# Patient Record
Sex: Male | Born: 1985 | Race: White | Hispanic: No | Marital: Married | Smoking: Never smoker
Health system: Southern US, Community
[De-identification: ages and names within clinical notes are randomized; demographics above are authoritative.]

---

## 2011-11-20 ENCOUNTER — Encounter (HOSPITAL_COMMUNITY): Payer: Self-pay | Admitting: *Deleted

## 2011-11-20 ENCOUNTER — Emergency Department (HOSPITAL_COMMUNITY)
Admission: EM | Admit: 2011-11-20 | Discharge: 2011-11-20 | Disposition: A | Discharge status: Home / Self Care | Payer: BC Managed Care – PPO | Source: Home / Self Care | Attending: Emergency Medicine | Admitting: Emergency Medicine

## 2011-11-20 DIAGNOSIS — IMO0002 Reserved for concepts with insufficient information to code with codable children: Secondary | ICD-10-CM

## 2011-11-20 DIAGNOSIS — T148XXA Other injury of unspecified body region, initial encounter: Secondary | ICD-10-CM

## 2011-11-20 MED ORDER — MUPIROCIN 2 % EX OINT: TOPICAL_OINTMENT | Freq: Three times a day (TID) | CUTANEOUS | Status: AC

## 2011-11-20 MED ORDER — CEPHALEXIN 500 MG PO CAPS: 500.0000 mg | ORAL_CAPSULE | Freq: Three times a day (TID) | ORAL | Status: AC

## 2011-11-20 NOTE — ED Provider Notes (Signed)
Chief Complaint  Patient presents with  . Wound Check    History of Present Illness:  Scott Kennedy is a 26 year old male who has a laceration on the plantar surface of the right foot. He kicked a soda machine about 3 days ago, and he has a flap of skin that has been been dangling. There is no evidence of infection, no erythema, swelling, or pus. He thinks his last tetanus shot was a year or 2 ago.  Review of Systems:  Other than noted above, the patient denies any of the following symptoms: Systemic:  No fever or chills. Musculoskeletal:  No joint pain or decreased range of motion. Neuro:  No numbness, tingling, or weakness.  PMFSH:  Past medical history, family history, social history, meds, and allergies were reviewed.  Physical Exam:   Vital signs:  BP 145/83  Pulse 67  Temp 98.1 F (36.7 C) (Oral)  Resp 17  SpO2 100% Ext:  There is a 1 cm evulsion laceration on the plantar surface of the right foot just underneath the MTP joint of the great toe. There is a flap of devitalized skin that is only attached on one side, no evidence of infection, no erythema, tenderness to palpation, or purulent drainage.  All joints had a full ROM without pain.  Pulses were full.  Good capillary refill in all digits.  No edema. Neurological:  Alert and oriented.  No muscle weakness.  Sensation was intact to light touch.   Procedure: Verbal informed consent was obtained.  The patient was informed of the risks and benefits of the procedure and understands and accepts.  Identity of the patient was verified verbally and by wristband.   The laceration area described above was prepped with Betadine and the flap of skin was just snipped off without any anesthesia. It was then cleaned up with saline, antibiotic ointment was applied, and a nonstick dressing. He was instructed in wound care.   Assessment:  The encounter diagnosis was Laceration.  Plan:   1.  The following meds were prescribed:   New Prescriptions   CEPHALEXIN (KEFLEX) 500 MG CAPSULE    Take 1 capsule (500 mg total) by mouth 3 (three) times daily.   MUPIROCIN OINTMENT (BACTROBAN) 2 %    Apply topically 3 (three) times daily.   2.  The patient was instructed in wound care and pain control, and handouts were given. 3.  The patient was told to return if any sign of infection.     Reuben Likes, MD 11/20/11 1250

## 2011-11-20 NOTE — ED Notes (Addendum)
Reports cutting bottom of right foot on corner of flapping door to vending machine on Thursday.  Has been applying abx oint and bandages, but c/o site still oozing blood intermittently.  Denies any S/S infection.  Avulsion of calloused area noted without active bleeding.  States has been unable to rest foot (in process of a move).  Last tetanus 2010.

## 2014-05-29 ENCOUNTER — Encounter (HOSPITAL_COMMUNITY): Payer: Self-pay | Admitting: Emergency Medicine

## 2014-05-29 ENCOUNTER — Emergency Department (HOSPITAL_COMMUNITY): Payer: BLUE CROSS/BLUE SHIELD

## 2014-05-29 ENCOUNTER — Emergency Department (HOSPITAL_COMMUNITY)
Admission: EM | Admit: 2014-05-29 | Discharge: 2014-05-30 | Disposition: A | Discharge status: Home / Self Care | Payer: BLUE CROSS/BLUE SHIELD | Attending: Emergency Medicine | Admitting: Emergency Medicine

## 2014-05-29 DIAGNOSIS — W2102XA Struck by soccer ball, initial encounter: Secondary | ICD-10-CM | POA: Insufficient documentation

## 2014-05-29 DIAGNOSIS — T1490XA Injury, unspecified, initial encounter: Secondary | ICD-10-CM

## 2014-05-29 DIAGNOSIS — S99912A Unspecified injury of left ankle, initial encounter: Secondary | ICD-10-CM | POA: Diagnosis present

## 2014-05-29 DIAGNOSIS — Y92322 Soccer field as the place of occurrence of the external cause: Secondary | ICD-10-CM | POA: Diagnosis not present

## 2014-05-29 DIAGNOSIS — S8252XA Displaced fracture of medial malleolus of left tibia, initial encounter for closed fracture: Secondary | ICD-10-CM | POA: Diagnosis not present

## 2014-05-29 DIAGNOSIS — Y9366 Activity, soccer: Secondary | ICD-10-CM | POA: Insufficient documentation

## 2014-05-29 DIAGNOSIS — T148XXA Other injury of unspecified body region, initial encounter: Secondary | ICD-10-CM

## 2014-05-29 DIAGNOSIS — S93402A Sprain of unspecified ligament of left ankle, initial encounter: Secondary | ICD-10-CM

## 2014-05-29 DIAGNOSIS — Y998 Other external cause status: Secondary | ICD-10-CM | POA: Diagnosis not present

## 2014-05-29 NOTE — ED Notes (Signed)
Pt st's he was playing soccer and twisted left ankle.

## 2014-05-30 MED ORDER — HYDROCODONE-ACETAMINOPHEN 5-325 MG PO TABS: 1.0000 | ORAL_TABLET | Freq: Four times a day (QID) | ORAL | Status: AC | PRN

## 2014-05-30 NOTE — ED Notes (Signed)
Ortho tech paged and will come apply cam walker

## 2014-05-30 NOTE — ED Notes (Signed)
Ortho tech at bedside 

## 2014-05-30 NOTE — Discharge Instructions (Signed)
Please call your doctor for a followup appointment within 24-48 hours. When you talk to your doctor please let them know that you were seen in the emergency department and have them acquire all of your records so that they can discuss the findings with you and formulate a treatment plan to fully care for your new and ongoing problems. Please call and set-up an appointment with orthopedics Please rest, ice, elevate-toes above nose Within the next 72 hours icing is crucial for reducing swelling Please slowly introduce heat after the next 72 hours and massage to aid in reabsorption of the hematoma Please rest and stay hydrated Please take medications as prescribed - while on pain medications there is to be no drinking alcohol, driving, operating any heavy machinery. If extra please dispose in a proper manner. Please do not take any extra Tylenol with this medication for this can lead to Tylenol overdose and liver issues.  Please continue to monitor symptoms closely and if symptoms are to worsen or change (fever greater than 101, chills, sweating, nausea, vomiting, chest pain, shortness of breathe, difficulty breathing, weakness, numbness, tingling, worsening or changes to pain pattern, fall, injury, changes to skin color, swelling to the leg, increased pain with moving toes) please report back to the Emergency Department immediately.    Ankle Sprain An ankle sprain is an injury to the strong, fibrous tissues (ligaments) that hold the bones of your ankle joint together.  CAUSES An ankle sprain is usually caused by a fall or by twisting your ankle. Ankle sprains most commonly occur when you step on the outer edge of your foot, and your ankle turns inward. People who participate in sports are more prone to these types of injuries.  SYMPTOMS   Pain in your ankle. The pain may be present at rest or only when you are trying to stand or walk.  Swelling.  Bruising. Bruising may develop immediately or within  1 to 2 days after your injury.  Difficulty standing or walking, particularly when turning corners or changing directions. DIAGNOSIS  Your caregiver will ask you details about your injury and perform a physical exam of your ankle to determine if you have an ankle sprain. During the physical exam, your caregiver will press on and apply pressure to specific areas of your foot and ankle. Your caregiver will try to move your ankle in certain ways. An X-ray exam may be done to be sure a bone was not broken or a ligament did not separate from one of the bones in your ankle (avulsion fracture).  TREATMENT  Certain types of braces can help stabilize your ankle. Your caregiver can make a recommendation for this. Your caregiver may recommend the use of medicine for pain. If your sprain is severe, your caregiver may refer you to a surgeon who helps to restore function to parts of your skeletal system (orthopedist) or a physical therapist. HOME CARE INSTRUCTIONS   Apply ice to your injury for 1-2 days or as directed by your caregiver. Applying ice helps to reduce inflammation and pain.  Put ice in a plastic bag.  Place a towel between your skin and the bag.  Leave the ice on for 15-20 minutes at a time, every 2 hours while you are awake.  Only take over-the-counter or prescription medicines for pain, discomfort, or fever as directed by your caregiver.  Elevate your injured ankle above the level of your heart as much as possible for 2-3 days.  If your caregiver recommends crutches,  use them as instructed. Gradually put weight on the affected ankle. Continue to use crutches or a cane until you can walk without feeling pain in your ankle.  If you have a plaster splint, wear the splint as directed by your caregiver. Do not rest it on anything harder than a pillow for the first 24 hours. Do not put weight on it. Do not get it wet. You may take it off to take a shower or bath.  You may have been given an  elastic bandage to wear around your ankle to provide support. If the elastic bandage is too tight (you have numbness or tingling in your foot or your foot becomes cold and blue), adjust the bandage to make it comfortable.  If you have an air splint, you may blow more air into it or let air out to make it more comfortable. You may take your splint off at night and before taking a shower or bath. Wiggle your toes in the splint several times per day to decrease swelling. SEEK MEDICAL CARE IF:   You have rapidly increasing bruising or swelling.  Your toes feel extremely cold or you lose feeling in your foot.  Your pain is not relieved with medicine. SEEK IMMEDIATE MEDICAL CARE IF:  Your toes are numb or blue.  You have severe pain that is increasing. MAKE SURE YOU:   Understand these instructions.  Will watch your condition.  Will get help right away if you are not doing well or get worse. Document Released: 05/02/2005 Document Revised: 01/25/2012 Document Reviewed: 05/14/2011 [facility] Patient Information 2015 [place], [place]. This information is not intended to replace advice given to you by your health care provider. Make sure you discuss any questions you have with your health care provider.   Emergency Department Resource Guide 1) Find a Doctor and Pay Out of Pocket Although you won't have to find out who is covered by your insurance plan, it is a good idea to ask around and get recommendations. You will then need to call the office and see if the doctor you have chosen will accept you as a new patient and what types of options they offer for patients who are self-pay. Some doctors offer discounts or will set up payment plans for their patients who do not have insurance, but you will need to ask so you aren't surprised when you get to your appointment.  2) Contact Your Local Health Department Not all health departments have doctors that can see patients for sick visits, but many do, so  it is worth a call to see if yours does. If you don't know where your local health department is, you can check in your phone book. The CDC also has a tool to help you locate your state's health department, and many state websites also have listings of all of their local health departments.  3) Find a Walk-in Clinic If your illness is not likely to be very severe or complicated, you may want to try a walk in clinic. These are popping up all over the country in pharmacies, drugstores, and shopping centers. They're usually staffed by nurse practitioners or physician assistants that have been trained to treat common illnesses and complaints. They're usually fairly quick and inexpensive. However, if you have serious medical issues or chronic medical problems, these are probably not your best option.  No Primary Care Doctor: - Call Health Connect at  [phone] - they can help you locate a primary care doctor that  accepts your insurance, provides certain services, etc. - Physician Referral Service- [phone]  Chronic Pain Problems: Organization         Address  Phone   Notes  [facility]  [phone] Patients need to be referred by their primary care doctor.   Medication Assistance: Organization         Address  Phone   Notes  [facility] Medication [facility] [address] [place], [place] [address] [phone] --Must be a resident of [facility] -- Must have NO insurance coverage whatsoever (no Medicaid/ Medicare, etc.) -- The pt. MUST have a primary care doctor that directs their care regularly and follows them in the community   [facility]  [phone]   [employer]  [phone]    Agencies that provide inexpensive medical care: Organization         Address  Phone   Notes  [facility]  [phone]   [facility]    [phone]   [facility] [address] [place], [place] [address] [phone]   [facility] [address] [place] [place] [phone]   Planned Parenthood    [phone]   [facility]    [phone]   [facility]  [address], [place] Phone:  [phone], Fax:  [phone] Hours of Operation:  9 am - 6 pm, M-F.  Also accepts Medicaid/Medicare and self-pay.  [facility]  [address], [place] Phone: [phone], Fax: [phone]. Hours of Operation:  8:30 am - 5:30 pm, M-F.  Also accepts Medicaid and self-pay.  [facility] [place] [address] [place] Phone: [phone]   [facility] [address] Natasha Bence [place], [place] [phone], Ext. [phone] Mondays & Thursdays: 7-9 AM.  First 15 patients are seen on a first come, first serve basis.    Medicaid-accepting [facility] Providers:  Organization         Address  Phone   Notes  [facility] [address], [place] [phone] Also accepts self-pay patients.  [facility] [address] Laurell Josephs [place], [place]  [phone]   [facility] [address], [place] [phone]   [facility] [address] [place] [phone]   Renaye Rakers [address], [place]   [phone] Only accepts [place] Access [place] patients after they have their name applied to their card.   Self-Pay (no insurance) in [facility]:  Organization         Address  Phone   Notes  Sickle Cell Patients, [facility] [address] [place], [place] [phone]   [facility] [address] [place], [place] [phone]   [facility]  [address], [place] [phone]   [facility]/Dr. Osei-Bonsu  [address] [place] or  [address], [place] [phone] Phone number for both [place] and [place] locations is the same.  [facility] [address] [place] [phone]   [facility] [address], [place] or [address] [phone]   [facility] [address], [place] [phone]) [phone], phone; [phone](336) [phone], fax Sees patients 1st and 3rd Saturday of every month.  Must not qualify for public or private insurance (i.e. Medicaid, Medicare, [place] Health Choice, Veterans' Benefits)  Household income should be no more than 200% of the poverty level The clinic cannot treat you if you are pregnant or think you are pregnant  Sexually transmitted diseases are not treated at the clinic.    Dental Care: Organization         Address  Phone  Notes  [facility] [address], [place] [phone]) [phone] Accepts children up to age 30 who are enrolled in [place] or [place] Health Choice; pregnant women with a Medicaid card; and children who have applied for Medicaid or [place] Health Choice, but were declined, whose parents can pay a reduced fee at time of service.  [facility] [place]  [address], [place] [phone]) [phone] Accepts children up to age 30 who are enrolled in [place] or [place] Health Choice; pregnant women with a Medicaid card; and children who have applied for Medicaid or  Health Choice, but were declined, whose parents can pay a reduced fee at time of service.  [facility]  [address], [place] [phone] Patients are seen by appointment only. Walk-ins are not accepted. [facility] will see patients 30 years of age and older. Monday - Tuesday (8am-5pm) Most Wednesdays (8:30-5pm) $30 per visit, cash only  [facility]  [address] [place] [address], [facility]  [place] [phone] Patients are seen by appointment only. Walk-ins are not accepted. [facility] will see patients 30 years of age and older. One Wednesday Evening (Monthly: Volunteer Based).  $30 per visit, cash only  [facility]  [phone] for adults; Children under age 744, call Graduate Pediatric Dentistry at [phone]) [phone]. Children aged 364-14, please call [phone] to request a pediatric application.  Dental services are provided in all areas of dental care including fillings, crowns and bridges, complete and partial dentures, implants, gum treatment, root canals, and extractions. Preventive care is also provided. Treatment is provided to both adults and children. Patients are selected via a lottery and there is often a waiting list.   [facility] [address], [place]  [phone]) [phone] [email or web address]   [facility] [address], [place], [place] [phone], Ext. [phone] Second and Fourth Thursday of each month, opens at 6:30 AM; Clinic ends at 9 AM.  Patients are seen on a first-come first-served basis, and a limited number are seen during each clinic.   [facility]  [address], [place], [place] [phone]) [phone]   Eligibility Requirements You must have lived in [place], [place], or [place] counties for at least the last three months.   You cannot be eligible for state or federal sponsored [place]healthcare insurance, including CIGNAVeterans Administration, [place]icaid, or [employer]icare.   You generally cannot be eligible for healthcare insurance through your employer.    How to apply: Eligibility screenings are held every Tuesday and Wednesday afternoon from 1:00 pm until 4:00 pm. You do not need an appointment for the interview!  [facility] [address], [place], [place] [phone]   [facility]  [phone]   [facility]  [phone]   [facility]  [phone]    Behavioral Health Resources in the Community: Intensive Outpatient Programs Organization         Address  Phone  Notes  [facility] Services [address] [place], [place] [phone]   [facility] Outpatient [address] [place], [place] [phone]   [facility] [address], [place], [place]  [phone]   [facility] Mental Health [address]  [place], [place] [phone] or [phone]   Substance Abuse Resources Organization         Address  Phone  Notes  [facility]  [phone]   [facility]  [phone]   [facility]  [phone]   Floydene Flock  [phone]   Residential & Outpatient Substance Abuse Program  [phone]   Psychological Services Organization         Address  Phone  Notes  [facility]  [phone]   [facility]  [phone]   [facility] Mental Health [address], [place] [phone] or [phone]    Mobile Crisis Teams Organization         Address  Phone  Notes  [facility], Mobile Crisis Care Unit  [phone]   [facility]  [address] [place], [place] [phone]   Doristine Locks [address] [place] [phone]    Self-Help/Support Groups Organization         Address  Phone             Notes  [facility] [place] - variety of support groups  336- I7437963 Call for more information  Narcotics Anonymous (NA), [facility] [address], [employer] [place]  2 meetings at this location   Statistician         Address  Phone  Notes  [facility] [address],    [place]  [phone]   [facility]  [address] [place] [address], [place], [place] [phone]   [facility] [address] [place], [place] [phone]  Admissions: 8am-3pm M-F  [facility] [address]    [place], [place] [phone]   [facility] [address] [place], [place], [place] [phone]   [facility] [address],  [place], [place] [phone]   [facility] - Intensive Outpatient [address]ure[address] Joseph[address], [place], [place] [phone]   [facility] ([facility] [address] [place]  [place], [place] [phone] or [phone]   [facility] ([facility]) [address] [place], [place] [phone] Accepts Medicaid  [facility] [address],  [place] [phone] Substance Abuse/Addiction Treatment   [facility] Organization         Address  Phone  Notes  [facility]  [phone]   Angie Fava, PhD [address] Ervin Knack [place], [place]   [phone] or [phone]   [facility]al   [address] [place], [place] [phone]   [facility] [address] [place], [place] [phone] Insurance/Medicaid/sponsorship through [employer] and Families [address]                                    [place], [place] [phone] Therapy/tele-psych/case  [facility] [address]  [place], [place] [phone]    Dr. Adele Schilder  [phone]   [facility] 1) [address], [place] 2) [place] 3)  [address], [place] [phone]  [phone]   [place] [phone] or [phone] (After Hours)

## 2014-05-30 NOTE — ED Provider Notes (Signed)
CSN: [id number]     Arrival date & time 05/29/14  2200 History   First MD Initiated Contact with Patient 05/29/14 2252     Chief Complaint  Patient presents with  . Ankle Pain     (Consider location/radiation/quality/duration/timing/severity/associated sxs/prior Treatment) The history is provided by the patient. No language interpreter was used.  Scott Kennedy is a 29 year old male with no known significant past medical history presenting to emergency department with left ankle pain that started this evening at approximately 9:00-9:15 PM while patient was playing soccer. Reported that he was kicking a ball, stated that his left leg was planted on the ground resulting in his left ankle twisting. Patient reported that he's been having pain localized to the left ankle-when sitting it is a dull aching pain that is constant, with motion there is a pulling sensation. Stated that when he applies pressure to the left foot there is a sharp pain. Stated that he believes that he sprained this ankle before. Stated that when the injury occurred he sat on the bench and elevated his foot. Denied loss of sensation, numbness, tingling. PCP none   History reviewed. No pertinent past medical history. History reviewed. No pertinent past surgical history. No family history on file. History  Substance Use Topics  . Smoking status: Never Smoker   . Smokeless tobacco: Not on file  . Alcohol Use: 4.2 oz/week    7 Cans of beer per week    Review of Systems  Musculoskeletal: Positive for joint swelling and arthralgias (Left ankle pain).  Neurological: Negative for weakness and numbness.      Allergies  Review of patient's allergies indicates no known allergies.  Home Medications   Prior to Admission medications   Medication Sig Start Date End Date Taking? Authorizing Provider  HYDROcodone-acetaminophen (NORCO/VICODIN) 5-325 MG per tablet Take 1 tablet by mouth every 6 (six) hours as needed. 05/30/14    Mattingly Fountaine, PA-C   BP 134/86 mmHg  Pulse 85  Temp(Src) 97.7 F (36.5 C) (Oral)  Resp 18  Ht  (1.88 m)  Wt 260 lb (117.935 kg)  BMI 33.37 kg/m2  SpO2 97% Physical Exam  Constitutional: He is oriented to person, place, and time. He appears well-developed and well-nourished. No distress.  HENT:  Head: Normocephalic and atraumatic.  Eyes: Conjunctivae and EOM are normal. Right eye exhibits no discharge. Left eye exhibits no discharge.  Neck: Normal range of motion. Neck supple.  Cardiovascular: Normal rate, regular rhythm and normal heart sounds.  Exam reveals no friction rub.   No murmur heard. Pulses:      Radial pulses are 2+ on the right side, and 2+ on the left side.       Dorsalis pedis pulses are 2+ on the right side, and 2+ on the left side.  Cap refill < 3 seconds  Pulmonary/Chest: Effort normal and breath sounds normal. No respiratory distress. He has no wheezes. He has no rales.  Musculoskeletal: He exhibits edema and tenderness.       Left ankle: He exhibits decreased range of motion (Secondary to pain) and swelling. He exhibits no ecchymosis, no deformity and no laceration. Tenderness. Medial malleolus and AITFL tenderness found.       Feet:  Moderate swelling identified to the left ankle circumferentially with negative ecchymosis, abrasions, lesions, sores, malalignment, deformities. Tenderness upon palpation to the medial malleolus and anterior talus fibular ligament. Patient is able to dorsiflex and plantar flex with pain-pain with inversion and eversion  of the left ankle. Patient is able to wiggle toes without difficulty. Compartment soft. Negative discoloration to the left foot or ankle.  Neurological: He is alert and oriented to person, place, and time. No cranial nerve deficit. He exhibits normal muscle tone. Coordination normal.  Cranial nerves III-XII grossly intact Strength 5+/5+ to lower extremities bilaterally with resistance applied, equal distribution  noted Strength intact to digits of the feet bilaterally Sensation intact with differentiation sharp and dull touch  Skin: Skin is warm and dry. No rash noted. He is not diaphoretic. No erythema.  Psychiatric: He has a normal mood and affect. His behavior is normal. Thought content normal.  Nursing note and vitals reviewed.   ED Course  Procedures (including critical care time) Labs Review Labs Reviewed - No data to display  Imaging Review Dg Ankle Complete Left  05/29/2014   CLINICAL DATA:  Left ankle pain and swelling after injury.  EXAM: LEFT ANKLE COMPLETE - 3+ VIEW  COMPARISON:  None.  FINDINGS: Small ossific densities adjacent to the distal medial malleolus, age-indeterminate avulsion injuries. No additional fracture. The ankle mortise is preserved. There is diffuse soft tissue edema about the ankle. Slightly more confluent soft tissue edema anteriorly may reflect hematoma. There is a small tibiotalar joint effusion.  IMPRESSION: 1. Small ossific densities distal to the medial malleolus, age-indeterminate avulsion injuries. 2. Diffuse soft tissue edema, confluent anteriorly and may reflect hematoma. There is a small tibial talar joint effusion.   Electronically Signed   By: Rubye OaksMelanie  Ehinger M.D.   On: 05/29/2014 23:57     EKG Interpretation None      MDM   Final diagnoses:  Avulsion fracture  Left ankle sprain, initial encounter    Medications - No data to display  Filed Vitals:   05/29/14 2234 05/30/14 0057  BP: 136/7 134/86  Pulse: 74 85  Temp: 98.2 F (36.8 C) 97.7 F (36.5 C)  TempSrc: Oral Oral  Resp: 18 18  Height: 6\' 2"  (1.88 m)   Weight: 260 lb (117.935 kg)   SpO2: 97% 97%   Plain film of left ankle noted small ossific densities distal to the medial malleolus-suspicion to be avulsion injuries. Diffuse soft tissue edema, confluent anteriorly and may reflect hematoma. There is small tibial pallor joint effusion. Moderate swelling identified to left ankle  circumferentially with negative ecchymosis, malalignment, deformities. Tenderness upon palpation to the medial malleolus. Negative focal neurological deficits. Sensation intact. Cap refill less than 3 seconds. Pulses palpable and strong. Suspicion to be a avulsion fracture of the medial malleolus with a sprain of the ligaments to the left ankle. Doubt compartment syndrome. Negative signs of ischemia. Patient placed in cam walker and crutches administered. Patient stable, afebrile. Patient not septic appearing. Discharged patient. Discharge patient with pain medications-discussed course, precautions, disposal technique. [facility] and [facility]ics. Discussed with patient to rest, ice, elevate. Discussed with patient to avoid any physical or strenuous activity. Discussed with patient to closely monitor symptoms and if symptoms are to worsen or change to report back to the ED - strict return instructions given.  Patient agreed to plan of care, understood, all questions answered.   Raymon MuttonMarissa Scorpio Fortin, PA-C 05/30/14 [id number]  Ward GivensIva L Knapp, MD 05/30/14 [phone]

## 2016-02-10 DIAGNOSIS — Z23 Encounter for immunization: Secondary | ICD-10-CM | POA: Diagnosis not present

## 2016-03-17 DIAGNOSIS — Z6834 Body mass index (BMI) 34.0-34.9, adult: Secondary | ICD-10-CM | POA: Diagnosis not present

## 2016-03-17 DIAGNOSIS — H1032 Unspecified acute conjunctivitis, left eye: Secondary | ICD-10-CM | POA: Diagnosis not present

## 2017-02-10 DIAGNOSIS — Z23 Encounter for immunization: Secondary | ICD-10-CM | POA: Diagnosis not present

## 2017-07-09 ENCOUNTER — Emergency Department (HOSPITAL_COMMUNITY)
Admission: EM | Admit: 2017-07-09 | Discharge: 2017-07-10 | Disposition: A | Discharge status: Home / Self Care | Payer: BLUE CROSS/BLUE SHIELD | Attending: Emergency Medicine | Admitting: Emergency Medicine

## 2017-07-09 ENCOUNTER — Encounter (HOSPITAL_COMMUNITY): Payer: Self-pay | Admitting: Nurse Practitioner

## 2017-07-09 ENCOUNTER — Emergency Department (HOSPITAL_COMMUNITY): Payer: BLUE CROSS/BLUE SHIELD

## 2017-07-09 DIAGNOSIS — R42 Dizziness and giddiness: Secondary | ICD-10-CM | POA: Diagnosis not present

## 2017-07-09 DIAGNOSIS — R55 Syncope and collapse: Secondary | ICD-10-CM | POA: Diagnosis not present

## 2017-07-09 DIAGNOSIS — R519 Headache, unspecified: Secondary | ICD-10-CM

## 2017-07-09 DIAGNOSIS — H53149 Visual discomfort, unspecified: Secondary | ICD-10-CM | POA: Diagnosis not present

## 2017-07-09 DIAGNOSIS — R51 Headache: Secondary | ICD-10-CM | POA: Diagnosis not present

## 2017-07-09 DIAGNOSIS — G4482 Headache associated with sexual activity: Secondary | ICD-10-CM | POA: Insufficient documentation

## 2017-07-09 LAB — COMPREHENSIVE METABOLIC PANEL
ALT: 28 U/L (ref 17–63)
ANION GAP: 10 (ref 5–15)
AST: 24 U/L (ref 15–41)
Albumin: 4.5 g/dL (ref 3.5–5.0)
Alkaline Phosphatase: 75 U/L (ref 38–126)
BILIRUBIN TOTAL: 0.9 mg/dL (ref 0.3–1.2)
BUN: 15 mg/dL (ref 6–20)
CHLORIDE: 107 mmol/L (ref 101–111)
CO2: 22 mmol/L (ref 22–32)
Calcium: 9.3 mg/dL (ref 8.9–10.3)
Creatinine, Ser: 1.08 mg/dL (ref 0.61–1.24)
GFR calc Af Amer: >60 mL/min (ref >60)
GFR calc non Af Amer: >60 mL/min (ref >60)
GLUCOSE: 109 mg/dL — AB (ref 65–99)
POTASSIUM: 4 mmol/L (ref 3.5–5.1)
Sodium: 139 mmol/L (ref 135–145)
TOTAL PROTEIN: 7.2 g/dL (ref 6.5–8.1)

## 2017-07-09 LAB — CBC
HEMATOCRIT: 45.2 % (ref 39.0–52.0)
HEMOGLOBIN: 16.1 g/dL (ref 13.0–17.0)
MCH: 31.3 pg (ref 26.0–34.0)
MCHC: 35.6 g/dL (ref 30.0–36.0)
MCV: 87.8 fL (ref 78.0–100.0)
Platelets: 229 10*3/uL (ref 150–400)
RBC: 5.15 MIL/uL (ref 4.22–5.81)
RDW: 12.2 % (ref 11.5–15.5)
WBC: 5.7 10*3/uL (ref 4.0–10.5)

## 2017-07-09 MED ORDER — PROCHLORPERAZINE EDISYLATE 5 MG/ML IJ SOLN
10.0000 mg | Freq: Once | INTRAMUSCULAR | Status: AC
  Administered 2017-07-09: 10 mg via INTRAVENOUS
  Filled 2017-07-09: qty 2

## 2017-07-09 MED ORDER — DIPHENHYDRAMINE HCL 50 MG/ML IJ SOLN
25.0000 mg | Freq: Once | INTRAMUSCULAR | Status: AC
  Administered 2017-07-09: 25 mg via INTRAVENOUS
  Filled 2017-07-09: qty 1

## 2017-07-09 MED ORDER — SODIUM CHLORIDE 0.9 % IV BOLUS (SEPSIS)
1000.0000 mL | Freq: Once | INTRAVENOUS | Status: AC
  Administered 2017-07-09: 1000 mL via INTRAVENOUS

## 2017-07-09 MED ORDER — PROCHLORPERAZINE MALEATE 10 MG PO TABS: 10.0000 mg | ORAL_TABLET | Freq: Two times a day (BID) | ORAL | 0 refills | Status: AC | PRN

## 2017-07-09 MED ORDER — IOPAMIDOL (ISOVUE-370) INJECTION 76%
INTRAVENOUS | Status: AC
  Administered 2017-07-09: 100 mL via INTRAVENOUS
  Filled 2017-07-09: qty 100

## 2017-07-09 NOTE — ED Notes (Signed)
Patient returned from CT

## 2017-07-09 NOTE — ED Provider Notes (Signed)
[facility]-EMERGENCY DEPT Provider Note   CSN: [id number] Arrival date & time: 07/09/17  1804     History   Chief Complaint Chief Complaint  Patient presents with  . Migraine    HPI Ewell Poendrew L Teschner is a 32 y.o. male.  The history is provided by the patient, the spouse and medical records.  Headache   This is a recurrent problem. The current episode started 3 to 5 hours ago. The problem occurs constantly. The problem has been rapidly improving. Associated with: intercourse. The pain is located in the parietal and bilateral region. The quality of the pain is described as throbbing and dull. The pain is at a severity of 10/10. The pain is severe. The pain does not radiate. Pertinent negatives include no fever, no malaise/fatigue, no near-syncope, no palpitations, no syncope, no shortness of breath, no nausea and no vomiting. He has tried nothing for the symptoms. The treatment provided no relief.    History reviewed. No pertinent past medical history.  There are no active problems to display for this patient.   History reviewed. No pertinent surgical history.     Home Medications    Prior to Admission medications   Medication Sig Start Date End Date Taking? Authorizing Provider  HYDROcodone-acetaminophen (NORCO/VICODIN) 5-325 MG per tablet Take 1 tablet by mouth every 6 (six) hours as needed. 05/30/14   Raymon MuttonSciacca, Marissa, PA-C    Family History History reviewed. No pertinent family history.  Social History Social History   Tobacco Use  . Smoking status: Never Smoker  . Smokeless tobacco: Never Used  Substance Use Topics  . Alcohol use: Yes    Alcohol/week: 4.2 oz    Types: 7 Cans of beer per week  . Drug use: No     Allergies   Patient has no known allergies.   Review of Systems Review of Systems  Constitutional: Negative for chills, diaphoresis, fatigue, fever and malaise/fatigue.  HENT: Negative for congestion.   Eyes: Positive  for photophobia. Negative for visual disturbance.  Respiratory: Negative for cough, chest tightness, shortness of breath and wheezing.   Cardiovascular: Negative for chest pain, palpitations, syncope and near-syncope.  Gastrointestinal: Negative for nausea and vomiting.  Genitourinary: Negative for dysuria and flank pain.  Musculoskeletal: Negative for back pain, neck pain and neck stiffness.  Neurological: Positive for light-headedness and headaches. Negative for dizziness and weakness.  Hematological: Negative for adenopathy.  Psychiatric/Behavioral: Negative for agitation.  All other systems reviewed and are negative.    Physical Exam Updated Vital Signs BP (!) 143/98 (BP Location: Left Arm)   Pulse 66   Temp 97.6 F (36.4 C)   Resp 18   SpO2 99%   Physical Exam  Constitutional: He is oriented to person, place, and time. He appears well-developed and well-nourished. No distress.  HENT:  Head: Normocephalic and atraumatic.  Mouth/Throat: Oropharynx is clear and moist. No oropharyngeal exudate.  Eyes: Conjunctivae and EOM are normal. Right pupil is not round.    Neck: Normal range of motion. Neck supple.  Cardiovascular: Normal rate and intact distal pulses.  No murmur heard. Pulmonary/Chest: No stridor. No respiratory distress. He exhibits no tenderness.  Abdominal: Bowel sounds are normal. He exhibits no distension. There is no tenderness.  Musculoskeletal: He exhibits no edema or tenderness.  Neurological: He is alert and oriented to person, place, and time. He is not disoriented. He displays no tremor and normal reflexes. No cranial nerve deficit or sensory deficit. He exhibits normal muscle  tone. He displays no seizure activity. Coordination and gait normal. GCS eye subscore is 4. GCS verbal subscore is 5. GCS motor subscore is 6.  Skin: Capillary refill takes less than 2 seconds. He is not diaphoretic. No erythema. No pallor.  Nursing note and vitals reviewed.    ED  Treatments / Results  Labs (all labs ordered are listed, but only abnormal results are displayed) Labs Reviewed  COMPREHENSIVE METABOLIC PANEL - Abnormal; Notable for the following components:      Result Value   Glucose, Bld 109 (*)    All other components within normal limits  CBC    EKG  EKG Interpretation None       Radiology Ct Angio Head W Or Wo Contrast  Result Date: 07/09/2017 CLINICAL DATA:  Severe headache and presyncope. EXAM: CT ANGIOGRAPHY HEAD AND NECK TECHNIQUE: Multidetector CT imaging of the head and neck was performed using the standard protocol during bolus administration of intravenous contrast. Multiplanar CT image reconstructions and MIPs were obtained to evaluate the vascular anatomy. Carotid stenosis measurements (when applicable) are obtained utilizing NASCET criteria, using the distal internal carotid diameter as the denominator. CONTRAST:  100 cc ISOVUE-370 IOPAMIDOL (ISOVUE-370) INJECTION 76% COMPARISON:  None. FINDINGS: CT HEAD FINDINGS BRAIN: No intraparenchymal hemorrhage, mass effect nor midline shift. The ventricles and sulci are normal. No acute large vascular territory infarcts. No abnormal extra-axial fluid collections. Basal cisterns are patent. VASCULAR: Unremarkable. SKULL/SOFT TISSUES: No skull fracture. No significant soft tissue swelling. ORBITS/SINUSES: The included ocular globes and orbital contents are normal.Trace ethmoid mucosal thickening. No paranasal sinus air-fluid levels. Mastoid air cells are well aerated. OTHER: None. CTA NECK FINDINGS: AORTIC ARCH: Normal appearance of the thoracic arch, 2 vessel arch is a normal variant. The origins of the innominate, left Common carotid artery and subclavian artery are widely patent. RIGHT CAROTID SYSTEM: Common carotid artery is widely patent, coursing in a straight line fashion. Normal appearance of the carotid bifurcation without hemodynamically significant stenosis by NASCET criteria. Normal appearance  of the internal carotid artery. LEFT CAROTID SYSTEM: Common carotid artery is widely patent, coursing in a straight line fashion. Focal intimal thickening and slight shelf like appearance LEFT ICA origin associated with type 1 fibromuscular dysplasia. Normal appearance of the internal carotid artery. VERTEBRAL ARTERIES:Left vertebral artery is dominant. Normal appearance of the vertebral arteries, widely patent. SKELETON: No acute osseous process though bone windows have not been submitted. C3-4 disc osteophyte complex without canal stenosis. No neural foraminal narrowing. OTHER NECK: Soft tissues of the neck are nonacute though, not tailored for evaluation. Scattered subcentimeter reniform lymph nodes are likely reactive. UPPER CHEST: Included lung apices are clear. No superior mediastinal lymphadenopathy. CTA HEAD FINDINGS: ANTERIOR CIRCULATION: Patent cervical internal carotid arteries, petrous, cavernous and supra clinoid internal carotid arteries. Patent anterior communicating artery. Patent anterior and middle cerebral arteries. No large vessel occlusion, significant stenosis, contrast extravasation or aneurysm. POSTERIOR CIRCULATION: Patent vertebral arteries, vertebrobasilar junction and basilar artery, as well as main branch vessels. Patent posterior cerebral arteries. Small RIGHT posterior communicating artery present. No large vessel occlusion, significant stenosis, contrast extravasation or aneurysm. VENOUS SINUSES: Major dural venous sinuses are patent though not tailored for evaluation on this angiographic examination. ANATOMIC VARIANTS: None. DELAYED PHASE: RIGHT frontal lobe developmental venous anomaly. MIP images reviewed. IMPRESSION: CT HEAD: 1. Negative CT HEAD with and without contrast. 2. Incidental RIGHT frontal developmental venous anomaly. CTA NECK: 1. No acute vascular process or hemodynamically significant stenosis. 2. Focal intimal thickening LEFT ICA  origin associated with fibromuscular  dysplasia type 1. CTA HEAD: 1. Negative CTA HEAD. Electronically Signed   By: Awilda Metro M.D.   On: 07/09/2017 22:57   Ct Angio Neck W And/or Wo Contrast  Result Date: 07/09/2017 CLINICAL DATA:  Severe headache and presyncope. EXAM: CT ANGIOGRAPHY HEAD AND NECK TECHNIQUE: Multidetector CT imaging of the head and neck was performed using the standard protocol during bolus administration of intravenous contrast. Multiplanar CT image reconstructions and MIPs were obtained to evaluate the vascular anatomy. Carotid stenosis measurements (when applicable) are obtained utilizing NASCET criteria, using the distal internal carotid diameter as the denominator. CONTRAST:  100 cc ISOVUE-370 IOPAMIDOL (ISOVUE-370) INJECTION 76% COMPARISON:  None. FINDINGS: CT HEAD FINDINGS BRAIN: No intraparenchymal hemorrhage, mass effect nor midline shift. The ventricles and sulci are normal. No acute large vascular territory infarcts. No abnormal extra-axial fluid collections. Basal cisterns are patent. VASCULAR: Unremarkable. SKULL/SOFT TISSUES: No skull fracture. No significant soft tissue swelling. ORBITS/SINUSES: The included ocular globes and orbital contents are normal.Trace ethmoid mucosal thickening. No paranasal sinus air-fluid levels. Mastoid air cells are well aerated. OTHER: None. CTA NECK FINDINGS: AORTIC ARCH: Normal appearance of the thoracic arch, 2 vessel arch is a normal variant. The origins of the innominate, left Common carotid artery and subclavian artery are widely patent. RIGHT CAROTID SYSTEM: Common carotid artery is widely patent, coursing in a straight line fashion. Normal appearance of the carotid bifurcation without hemodynamically significant stenosis by NASCET criteria. Normal appearance of the internal carotid artery. LEFT CAROTID SYSTEM: Common carotid artery is widely patent, coursing in a straight line fashion. Focal intimal thickening and slight shelf like appearance LEFT ICA origin associated  with type 1 fibromuscular dysplasia. Normal appearance of the internal carotid artery. VERTEBRAL ARTERIES:Left vertebral artery is dominant. Normal appearance of the vertebral arteries, widely patent. SKELETON: No acute osseous process though bone windows have not been submitted. C3-4 disc osteophyte complex without canal stenosis. No neural foraminal narrowing. OTHER NECK: Soft tissues of the neck are nonacute though, not tailored for evaluation. Scattered subcentimeter reniform lymph nodes are likely reactive. UPPER CHEST: Included lung apices are clear. No superior mediastinal lymphadenopathy. CTA HEAD FINDINGS: ANTERIOR CIRCULATION: Patent cervical internal carotid arteries, petrous, cavernous and supra clinoid internal carotid arteries. Patent anterior communicating artery. Patent anterior and middle cerebral arteries. No large vessel occlusion, significant stenosis, contrast extravasation or aneurysm. POSTERIOR CIRCULATION: Patent vertebral arteries, vertebrobasilar junction and basilar artery, as well as main branch vessels. Patent posterior cerebral arteries. Small RIGHT posterior communicating artery present. No large vessel occlusion, significant stenosis, contrast extravasation or aneurysm. VENOUS SINUSES: Major dural venous sinuses are patent though not tailored for evaluation on this angiographic examination. ANATOMIC VARIANTS: None. DELAYED PHASE: RIGHT frontal lobe developmental venous anomaly. MIP images reviewed. IMPRESSION: CT HEAD: 1. Negative CT HEAD with and without contrast. 2. Incidental RIGHT frontal developmental venous anomaly. CTA NECK: 1. No acute vascular process or hemodynamically significant stenosis. 2. Focal intimal thickening LEFT ICA origin associated with fibromuscular dysplasia type 1. CTA HEAD: 1. Negative CTA HEAD. Electronically Signed   By: Awilda Metro M.D.   On: 07/09/2017 22:57    Procedures Procedures (including critical care time)  Medications Ordered in  ED Medications  sodium chloride 0.9 % bolus 1,000 mL (0 mLs Intravenous Stopped 07/09/17 2315)  prochlorperazine (COMPAZINE) injection 10 mg (10 mg Intravenous Given 07/09/17 2207)  diphenhydrAMINE (BENADRYL) injection 25 mg (25 mg Intravenous Given 07/09/17 2208)  iopamidol (ISOVUE-370) 76 % injection (100 mLs Intravenous Contrast  Given 07/09/17 2210)     Initial Impression / Assessment and Plan / ED Course  I have reviewed the triage vital signs and the nursing notes.  Pertinent labs & imaging results that were available during my care of the patient were reviewed by me and considered in my medical decision making (see chart for details).     KENTREL CLEVENGER is a 32 y.o. male with a past medical history headaches who presents with severe headache.  Patient is accompanied by wife who reports that he has had several headaches over the last few days.  He says that they were very brief and sharp headaches.  He says that he has not been sleeping much over the last week due to increase in press at work.  He has tried over-the-counter medications with minimal relief.  He says that tonight, at approximately 5 PM, he was having intercourse with his wife when he had sudden onset of his worst headache.  He describes it as greater than 10 out of 10 and throbbing across his head.  He says it was worse than prior headaches.  He reports no nausea, vomiting, or vision changes.  He denied neck pain.  He reported some mild lightheadedness.  The headache started less than 5 hours ago.  On exam, patient had no focal neurologic deficits.  Patient's headache was improving.  Patient reported associated phonophobia and photophobia.  Patient's lungs clear and chest nontender.  Patient had normal strength sensation and coordination.  Normal gait.  Patient's right pupil is irregular and this is his baseline.  Due to the headache occurring during intercourse, patient will have imaging to look for subarachnoid hemorrhage  or aneurysm.  CT head and CT A ordered.  CT head showed no evidence of aneurysm or hemorrhage.  As the imaging was collected within several hours of onset, do not feel patient needs lumbar puncture to look for xanthochromia.  Patient was given a headache cocktail during initial workup with near complete resolution of headache.  Other findings found on CT included intimal thickening of the right ICA which may be related to fibromuscular dysplasia.  Patient was informed of this finding.  Patient will follow up with PCP and neurologist for further management.  Given patient's resolution of headache and reassuring exam, patient was felt stable for discharge home.  Patient given prescription for Compazine as this helped tonight.  Patient will also observe strict return precautions for any new or worsening symptoms.  Patient and family had no other questions or concerns and patient was discharged in good condition.     Final Clinical Impressions(s) / ED Diagnoses   Final diagnoses:  Acute nonintractable headache, unspecified headache type  Coital headache    ED Discharge Orders        Ordered    prochlorperazine (COMPAZINE) 10 MG tablet  2 times daily PRN     07/09/17 2349      Clinical Impression: 1. Acute nonintractable headache, unspecified headache type   2. Coital headache     Disposition: Discharge  Condition: Good  I have discussed the results, Dx and Tx plan with the pt(& family if present). He/she/they expressed understanding and agree(s) with the plan. Discharge instructions discussed at great length. Strict return precautions discussed and pt &/or family have verbalized understanding of the instructions. No further questions at time of discharge.    Discharge Medication List as of 07/09/2017 11:51 PM    START taking these medications   Details  prochlorperazine (  COMPAZINE) 10 MG tablet Take 1 tablet (10 mg total) by mouth 2 (two) times daily as needed for nausea or  vomiting (and headache)., Starting Sun 07/09/2017, Print        Follow Up: Alysia Penna, MD [address] [place] [address] [phone]     [facility] [address]     [address] [place] [address] [phone]       Judieth Mckown, Canary Brim, MD 07/10/17 [phone]

## 2017-07-09 NOTE — Discharge Instructions (Signed)
Your imaging today showed no evidence of intracranial abnormality, bleed, or aneurysm.  I suspect your headache was due to a coital headache.  The one finding that we discussed on your neck CT was possible fibromuscular dysplasia.  Please follow-up with your PCP and neurologist for further evaluation and management.  Please stay hydrated and get some sleep.  Please use the nausea medicine to help with your headache and to stay hydrated.  If any symptoms change or worsen, please return to the nearest emergency department.

## 2017-07-09 NOTE — ED Notes (Signed)
Patient transported to CT 

## 2017-07-09 NOTE — ED Triage Notes (Signed)
Pt is c/o severe HAs, "worsened today so much so that he almost passed out." Called on call PCP who advised him to come in for further eval. Denies nausea, head cold or hx migraines.

## 2017-07-20 DIAGNOSIS — Z0000 Encounter for general adult medical examination without abnormal findings: Secondary | ICD-10-CM | POA: Diagnosis not present

## 2017-07-24 DIAGNOSIS — R51 Headache: Secondary | ICD-10-CM | POA: Diagnosis not present

## 2017-07-24 DIAGNOSIS — I773 Arterial fibromuscular dysplasia: Secondary | ICD-10-CM | POA: Diagnosis not present

## 2017-07-24 DIAGNOSIS — Z1389 Encounter for screening for other disorder: Secondary | ICD-10-CM | POA: Diagnosis not present

## 2017-07-24 DIAGNOSIS — Z6834 Body mass index (BMI) 34.0-34.9, adult: Secondary | ICD-10-CM | POA: Diagnosis not present

## 2017-07-24 DIAGNOSIS — Z0000 Encounter for general adult medical examination without abnormal findings: Secondary | ICD-10-CM | POA: Diagnosis not present

## 2017-07-26 ENCOUNTER — Other Ambulatory Visit: Payer: Self-pay | Admitting: Internal Medicine

## 2017-07-26 DIAGNOSIS — I773 Arterial fibromuscular dysplasia: Secondary | ICD-10-CM

## 2017-08-08 ENCOUNTER — Other Ambulatory Visit: Payer: Self-pay

## 2017-08-22 ENCOUNTER — Ambulatory Visit
Admission: RE | Admit: 2017-08-22 | Discharge: 2017-08-22 | Disposition: A | Discharge status: Home / Self Care | Payer: BLUE CROSS/BLUE SHIELD | Source: Ambulatory Visit | Attending: Internal Medicine | Admitting: Internal Medicine

## 2017-08-22 DIAGNOSIS — I773 Arterial fibromuscular dysplasia: Secondary | ICD-10-CM

## 2018-02-09 DIAGNOSIS — Z23 Encounter for immunization: Secondary | ICD-10-CM | POA: Diagnosis not present

## 2018-07-23 DIAGNOSIS — Z0000 Encounter for general adult medical examination without abnormal findings: Secondary | ICD-10-CM | POA: Diagnosis not present

## 2018-07-23 DIAGNOSIS — E786 Lipoprotein deficiency: Secondary | ICD-10-CM | POA: Diagnosis not present

## 2018-07-23 DIAGNOSIS — N5089 Other specified disorders of the male genital organs: Secondary | ICD-10-CM | POA: Diagnosis not present

## 2018-07-30 DIAGNOSIS — Z0000 Encounter for general adult medical examination without abnormal findings: Secondary | ICD-10-CM | POA: Diagnosis not present

## 2018-07-30 DIAGNOSIS — Z6834 Body mass index (BMI) 34.0-34.9, adult: Secondary | ICD-10-CM | POA: Diagnosis not present

## 2018-07-30 DIAGNOSIS — Z1331 Encounter for screening for depression: Secondary | ICD-10-CM | POA: Diagnosis not present

## 2019-02-01 ENCOUNTER — Other Ambulatory Visit: Payer: Self-pay | Admitting: Oncology

## 2019-05-06 ENCOUNTER — Ambulatory Visit: Payer: BC Managed Care – PPO | Attending: Internal Medicine

## 2019-05-06 DIAGNOSIS — Z20822 Contact with and (suspected) exposure to covid-19: Secondary | ICD-10-CM

## 2019-05-07 LAB — NOVEL CORONAVIRUS, NAA: SARS-CoV-2, NAA: NOT DETECTED

## 2019-08-01 DIAGNOSIS — E786 Lipoprotein deficiency: Secondary | ICD-10-CM | POA: Diagnosis not present

## 2019-08-01 DIAGNOSIS — Z0000 Encounter for general adult medical examination without abnormal findings: Secondary | ICD-10-CM | POA: Diagnosis not present

## 2019-08-06 DIAGNOSIS — Z0000 Encounter for general adult medical examination without abnormal findings: Secondary | ICD-10-CM | POA: Diagnosis not present

## 2019-08-06 DIAGNOSIS — Z1331 Encounter for screening for depression: Secondary | ICD-10-CM | POA: Diagnosis not present

## 2019-10-17 IMAGING — CT CT ANGIO NECK
2 of 14 series · 6 of 33 positions shown · IV contrast (iopamidol)
Comparison: None.

CLINICAL DATA: Severe headache and presyncope.

EXAM:
CT ANGIOGRAPHY HEAD AND NECK
TECHNIQUE: Multidetector CT imaging of the head and neck was performed using
the standard protocol during bolus administration of intravenous
contrast. Multiplanar CT image reconstructions and MIPs were
obtained to evaluate the vascular anatomy. Carotid stenosis
measurements (when applicable) are obtained utilizing NASCET
criteria, using the distal internal carotid diameter as the
denominator.
CONTRAST:  100 cc [id number] IOPAMIDOL ([id number]) INJECTION 76%

[Series 13: cta head neck thins · axial · 0.49mm/px · z∈[-337,-110]mm · 4 of 755 slices shown]
[im 151/755  soft-tissue]
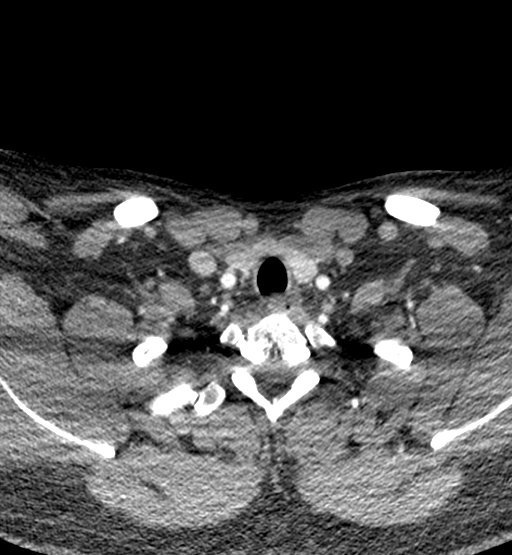
[im 302/755  bone]
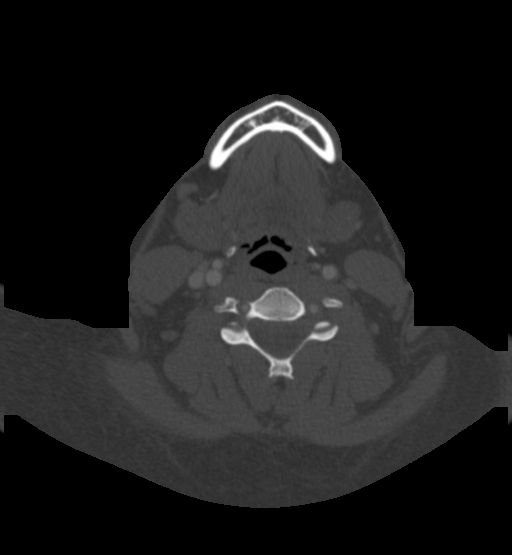
[im 453/755  soft-tissue]
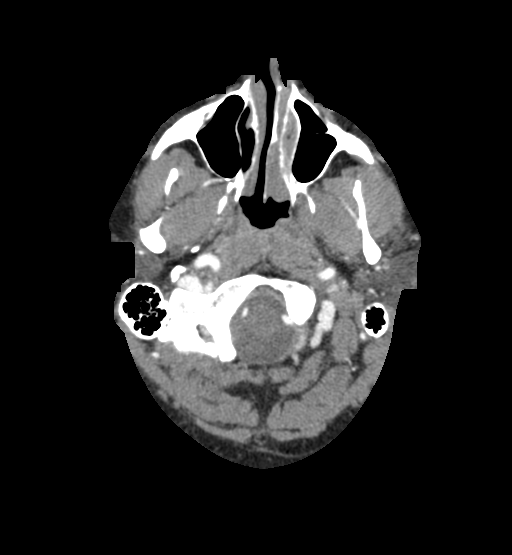
[im 604/755  bone]
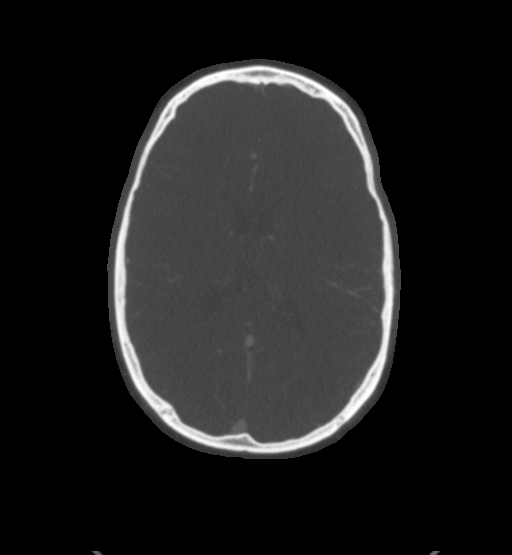

[Series 14: ax thin · axial · 0.49mm/px · z∈[-287,-161]mm · 2 of 378 slices shown]
[im 126/378  soft-tissue]
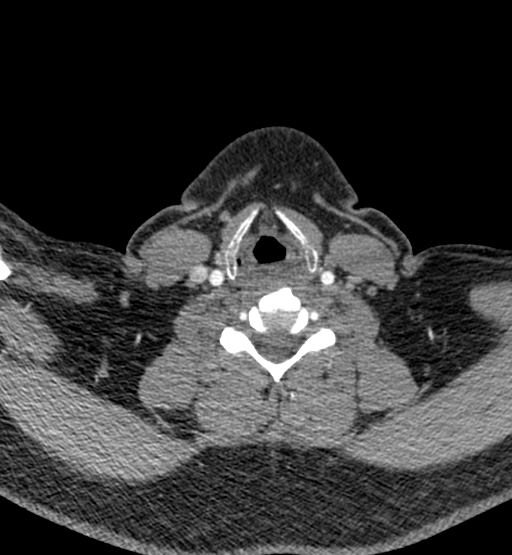
[im 252/378  soft-tissue]
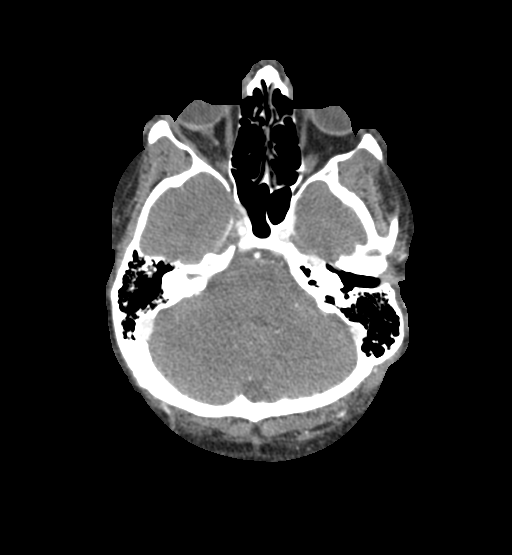

[6 of 33 positions shown; findings below may reference images not displayed]

FINDINGS: CT HEAD FINDINGS

BRAIN: No intraparenchymal hemorrhage, mass effect nor midline
shift. The ventricles and sulci are normal. No acute large vascular
territory infarcts. No abnormal extra-axial fluid collections. Basal
cisterns are patent.

VASCULAR: Unremarkable.

SKULL/SOFT TISSUES: No skull fracture. No significant soft tissue
swelling.

ORBITS/SINUSES: The included ocular globes and orbital contents are
normal.Trace ethmoid mucosal thickening. No paranasal sinus
air-fluid levels. Mastoid air cells are well aerated.

OTHER: None.

CTA NECK FINDINGS:

AORTIC ARCH: Normal appearance of the thoracic arch, 2 vessel arch
is a normal variant. The origins of the innominate, left Common
carotid artery and subclavian artery are widely patent.

RIGHT CAROTID SYSTEM: Common carotid artery is widely patent,
coursing in a straight line fashion. Normal appearance of the
carotid bifurcation without hemodynamically significant stenosis by
NASCET criteria. Normal appearance of the internal carotid artery.

LEFT CAROTID SYSTEM: Common carotid artery is widely patent,
coursing in a straight line fashion. Focal intimal thickening and
slight shelf like appearance LEFT ICA origin associated with type 1
fibromuscular dysplasia. Normal appearance of the internal carotid
artery.

VERTEBRAL ARTERIES:Left vertebral artery is dominant. Normal
appearance of the vertebral arteries, widely patent.

SKELETON: No acute osseous process though bone windows have not been
submitted. C3-4 disc osteophyte complex without canal stenosis. No
neural foraminal narrowing.

OTHER NECK: Soft tissues of the neck are nonacute though, not
tailored for evaluation. Scattered subcentimeter reniform lymph
nodes are likely reactive.

UPPER CHEST: Included lung apices are clear. No superior mediastinal
lymphadenopathy.

CTA HEAD FINDINGS:

ANTERIOR CIRCULATION: Patent cervical internal carotid arteries,
petrous, cavernous and supra clinoid internal carotid arteries.
Patent anterior communicating artery. Patent anterior and middle
cerebral arteries.

No large vessel occlusion, significant stenosis, contrast
extravasation or aneurysm.

POSTERIOR CIRCULATION: Patent vertebral arteries, vertebrobasilar
junction and basilar artery, as well as main branch vessels. Patent
posterior cerebral arteries. Small RIGHT posterior communicating
artery present.

No large vessel occlusion, significant stenosis, contrast
extravasation or aneurysm.

VENOUS SINUSES: Major dural venous sinuses are patent though not
tailored for evaluation on this angiographic examination.

ANATOMIC VARIANTS: None.

DELAYED PHASE: RIGHT frontal lobe developmental venous anomaly.

MIP images reviewed.
IMPRESSION: CT HEAD:

1. Negative CT HEAD with and without contrast.
2. Incidental RIGHT frontal developmental venous anomaly.

CTA NECK:

1. No acute vascular process or hemodynamically significant
stenosis.
2. Focal intimal thickening LEFT ICA origin associated with
fibromuscular dysplasia type 1.

CTA HEAD:

1. Negative CTA HEAD.

## 2019-10-28 ENCOUNTER — Ambulatory Visit: Payer: BC Managed Care – PPO | Attending: Internal Medicine

## 2019-10-28 DIAGNOSIS — Z20822 Contact with and (suspected) exposure to covid-19: Secondary | ICD-10-CM

## 2019-10-29 LAB — NOVEL CORONAVIRUS, NAA: SARS-CoV-2, NAA: NOT DETECTED

## 2019-10-29 LAB — SARS-COV-2, NAA 2 DAY TAT

## 2020-02-13 DIAGNOSIS — Z23 Encounter for immunization: Secondary | ICD-10-CM | POA: Diagnosis not present

## 2020-02-17 DIAGNOSIS — Z20828 Contact with and (suspected) exposure to other viral communicable diseases: Secondary | ICD-10-CM | POA: Diagnosis not present

## 2020-04-07 ENCOUNTER — Ambulatory Visit: Payer: BC Managed Care – PPO | Attending: Internal Medicine

## 2020-04-07 DIAGNOSIS — Z23 Encounter for immunization: Secondary | ICD-10-CM

## 2020-04-07 NOTE — Progress Notes (Signed)
   [facility]  Name:  Scott Kennedy    MRN: [id number] DOB: 23-Sep-1985  04/07/2020  [name] was observed post Covid-19 immunization for 15 minutes without incident. He was provided with Vaccine Information Sheet and instruction to access the [employer]-Safe system.   [name] was instructed to call [phone] with any severe reactions post vaccine: Marland Kitchen Difficulty breathing  . Swelling of face and throat  . A fast heartbeat  . A bad rash all over body  . Dizziness and weakness   Immunizations Administered    No immunizations on file.

## 2020-05-17 DIAGNOSIS — Z03818 Encounter for observation for suspected exposure to other biological agents ruled out: Secondary | ICD-10-CM | POA: Diagnosis not present

## 2020-05-20 ENCOUNTER — Other Ambulatory Visit: Payer: BC Managed Care – PPO

## 2021-01-13 DIAGNOSIS — Z0000 Encounter for general adult medical examination without abnormal findings: Secondary | ICD-10-CM | POA: Diagnosis not present

## 2021-01-15 DIAGNOSIS — R197 Diarrhea, unspecified: Secondary | ICD-10-CM | POA: Diagnosis not present

## 2021-01-15 DIAGNOSIS — Z1339 Encounter for screening examination for other mental health and behavioral disorders: Secondary | ICD-10-CM | POA: Diagnosis not present

## 2021-01-15 DIAGNOSIS — Z0000 Encounter for general adult medical examination without abnormal findings: Secondary | ICD-10-CM | POA: Diagnosis not present

## 2021-01-15 DIAGNOSIS — Z1331 Encounter for screening for depression: Secondary | ICD-10-CM | POA: Diagnosis not present

## 2021-02-09 DIAGNOSIS — H5201 Hypermetropia, right eye: Secondary | ICD-10-CM | POA: Diagnosis not present

## 2021-02-09 DIAGNOSIS — Q130 Coloboma of iris: Secondary | ICD-10-CM | POA: Diagnosis not present

## 2021-02-09 DIAGNOSIS — H53001 Unspecified amblyopia, right eye: Secondary | ICD-10-CM | POA: Diagnosis not present

## 2021-02-12 DIAGNOSIS — Z23 Encounter for immunization: Secondary | ICD-10-CM | POA: Diagnosis not present

## 2022-01-19 DIAGNOSIS — Z0000 Encounter for general adult medical examination without abnormal findings: Secondary | ICD-10-CM | POA: Diagnosis not present

## 2022-01-25 DIAGNOSIS — Z1331 Encounter for screening for depression: Secondary | ICD-10-CM | POA: Diagnosis not present

## 2022-01-25 DIAGNOSIS — Z0000 Encounter for general adult medical examination without abnormal findings: Secondary | ICD-10-CM | POA: Diagnosis not present

## 2022-01-25 DIAGNOSIS — Z1339 Encounter for screening examination for other mental health and behavioral disorders: Secondary | ICD-10-CM | POA: Diagnosis not present

## 2022-01-25 DIAGNOSIS — Z23 Encounter for immunization: Secondary | ICD-10-CM | POA: Diagnosis not present

## 2022-02-11 DIAGNOSIS — Z23 Encounter for immunization: Secondary | ICD-10-CM | POA: Diagnosis not present

## 2023-01-24 DIAGNOSIS — Z0000 Encounter for general adult medical examination without abnormal findings: Secondary | ICD-10-CM | POA: Diagnosis not present

## 2023-02-10 DIAGNOSIS — Z23 Encounter for immunization: Secondary | ICD-10-CM | POA: Diagnosis not present

## 2023-02-10 DIAGNOSIS — Z1339 Encounter for screening examination for other mental health and behavioral disorders: Secondary | ICD-10-CM | POA: Diagnosis not present

## 2023-02-10 DIAGNOSIS — Z1331 Encounter for screening for depression: Secondary | ICD-10-CM | POA: Diagnosis not present

## 2023-02-10 DIAGNOSIS — Z0000 Encounter for general adult medical examination without abnormal findings: Secondary | ICD-10-CM | POA: Diagnosis not present

## 2023-02-17 DIAGNOSIS — Z23 Encounter for immunization: Secondary | ICD-10-CM | POA: Diagnosis not present

## 2024-02-09 DIAGNOSIS — R7989 Other specified abnormal findings of blood chemistry: Secondary | ICD-10-CM | POA: Diagnosis not present

## 2024-02-09 DIAGNOSIS — Z1389 Encounter for screening for other disorder: Secondary | ICD-10-CM | POA: Diagnosis not present

## 2024-02-09 DIAGNOSIS — Z0189 Encounter for other specified special examinations: Secondary | ICD-10-CM | POA: Diagnosis not present

## 2024-02-09 DIAGNOSIS — Z0000 Encounter for general adult medical examination without abnormal findings: Secondary | ICD-10-CM | POA: Diagnosis not present

## 2024-02-16 DIAGNOSIS — Z1331 Encounter for screening for depression: Secondary | ICD-10-CM | POA: Diagnosis not present

## 2024-02-16 DIAGNOSIS — Z1339 Encounter for screening examination for other mental health and behavioral disorders: Secondary | ICD-10-CM | POA: Diagnosis not present

## 2024-02-16 DIAGNOSIS — Z0000 Encounter for general adult medical examination without abnormal findings: Secondary | ICD-10-CM | POA: Diagnosis not present
